# Patient Record
Sex: Male | Born: 1935 | Race: Black or African American | Hispanic: No | Smoking: Never smoker
Health system: Southern US, Community
[De-identification: ages and names within clinical notes are randomized; demographics above are authoritative.]

## PROBLEM LIST (undated history)

## (undated) DIAGNOSIS — I10 Essential (primary) hypertension: Secondary | ICD-10-CM

---

## 2009-03-24 ENCOUNTER — Ambulatory Visit: Payer: Self-pay | Admitting: Family Medicine

## 2009-03-24 ENCOUNTER — Inpatient Hospital Stay (HOSPITAL_COMMUNITY): Admission: EM | Admit: 2009-03-24 | Discharge: 2009-03-28 | Payer: Self-pay | Admitting: Emergency Medicine

## 2009-03-26 ENCOUNTER — Encounter: Payer: Self-pay | Admitting: Family Medicine

## 2009-03-27 ENCOUNTER — Ambulatory Visit: Payer: Self-pay | Admitting: Dentistry

## 2009-04-04 ENCOUNTER — Encounter: Admission: AD | Admit: 2009-04-04 | Discharge: 2009-04-04 | Payer: Self-pay | Admitting: Dentistry

## 2011-01-28 LAB — PROTIME-INR
INR: 1.7 — ABNORMAL HIGH (ref 0.00–1.49)
Prothrombin Time: 18 seconds — ABNORMAL HIGH (ref 11.6–15.2)
Prothrombin Time: 21.1 seconds — ABNORMAL HIGH (ref 11.6–15.2)

## 2011-01-28 LAB — CBC
HCT: 35.6 % — ABNORMAL LOW (ref 39.0–52.0)
HCT: 36.9 % — ABNORMAL LOW (ref 39.0–52.0)
HCT: 37.1 % — ABNORMAL LOW (ref 39.0–52.0)
HCT: 38.1 % — ABNORMAL LOW (ref 39.0–52.0)
Hemoglobin: 11.9 g/dL — ABNORMAL LOW (ref 13.0–17.0)
Hemoglobin: 12.9 g/dL — ABNORMAL LOW (ref 13.0–17.0)
MCHC: 32.8 g/dL (ref 30.0–36.0)
MCV: 89.2 fL (ref 78.0–100.0)
MCV: 90.9 fL (ref 78.0–100.0)
MCV: 91.5 fL (ref 78.0–100.0)
Platelets: 77 10*3/uL — ABNORMAL LOW (ref 150–400)
Platelets: 86 10*3/uL — ABNORMAL LOW (ref 150–400)
Platelets: 86 10*3/uL — ABNORMAL LOW (ref 150–400)
Platelets: 99 10*3/uL — ABNORMAL LOW (ref 150–400)
RBC: 4.14 MIL/uL — ABNORMAL LOW (ref 4.22–5.81)
RBC: 4.22 MIL/uL (ref 4.22–5.81)
RBC: 4.28 MIL/uL (ref 4.22–5.81)
RDW: 14.1 % (ref 11.5–15.5)
RDW: 14.5 % (ref 11.5–15.5)
RDW: 15.2 % (ref 11.5–15.5)
RDW: 15.3 % (ref 11.5–15.5)
WBC: 10.3 10*3/uL (ref 4.0–10.5)
WBC: 11.3 10*3/uL — ABNORMAL HIGH (ref 4.0–10.5)
WBC: 12.4 10*3/uL — ABNORMAL HIGH (ref 4.0–10.5)
WBC: 12.5 10*3/uL — ABNORMAL HIGH (ref 4.0–10.5)
WBC: 13.5 10*3/uL — ABNORMAL HIGH (ref 4.0–10.5)

## 2011-01-28 LAB — URINALYSIS, ROUTINE W REFLEX MICROSCOPIC
Nitrite: NEGATIVE
Specific Gravity, Urine: 1.025 (ref 1.005–1.030)
pH: 6 (ref 5.0–8.0)

## 2011-01-28 LAB — COMPREHENSIVE METABOLIC PANEL
ALT: 41 U/L (ref 0–53)
AST: 54 U/L — ABNORMAL HIGH (ref 0–37)
AST: 64 U/L — ABNORMAL HIGH (ref 0–37)
Albumin: 2.2 g/dL — ABNORMAL LOW (ref 3.5–5.2)
Albumin: 2.6 g/dL — ABNORMAL LOW (ref 3.5–5.2)
Albumin: 3.1 g/dL — ABNORMAL LOW (ref 3.5–5.2)
Alkaline Phosphatase: 63 U/L (ref 39–117)
Alkaline Phosphatase: 65 U/L (ref 39–117)
BUN: 17 mg/dL (ref 6–23)
Calcium: 8 mg/dL — ABNORMAL LOW (ref 8.4–10.5)
Chloride: 104 mEq/L (ref 96–112)
Chloride: 108 mEq/L (ref 96–112)
Chloride: 109 mEq/L (ref 96–112)
Creatinine, Ser: 1.64 mg/dL — ABNORMAL HIGH (ref 0.4–1.5)
GFR calc Af Amer: 50 mL/min — ABNORMAL LOW (ref >60)
Glucose, Bld: 125 mg/dL — ABNORMAL HIGH (ref 70–99)
Potassium: 3.3 mEq/L — ABNORMAL LOW (ref 3.5–5.1)
Potassium: 3.7 mEq/L (ref 3.5–5.1)
Sodium: 141 mEq/L (ref 135–145)
Total Bilirubin: 0.3 mg/dL (ref 0.3–1.2)
Total Bilirubin: 0.9 mg/dL (ref 0.3–1.2)
Total Bilirubin: 1.7 mg/dL — ABNORMAL HIGH (ref 0.3–1.2)
Total Protein: 6 g/dL (ref 6.0–8.3)
Total Protein: 6.3 g/dL (ref 6.0–8.3)

## 2011-01-28 LAB — BASIC METABOLIC PANEL
BUN: 13 mg/dL (ref 6–23)
CO2: 21 mEq/L (ref 19–32)
Calcium: 7.4 mg/dL — ABNORMAL LOW (ref 8.4–10.5)
Calcium: 8.4 mg/dL (ref 8.4–10.5)
Creatinine, Ser: 1.12 mg/dL (ref 0.4–1.5)
Creatinine, Ser: 1.25 mg/dL (ref 0.4–1.5)
GFR calc Af Amer: >60 mL/min (ref >60)
GFR calc non Af Amer: 56 mL/min — ABNORMAL LOW (ref >60)
GFR calc non Af Amer: >60 mL/min (ref >60)
Glucose, Bld: 127 mg/dL — ABNORMAL HIGH (ref 70–99)
Potassium: 3.6 mEq/L (ref 3.5–5.1)
Sodium: 140 mEq/L (ref 135–145)

## 2011-01-28 LAB — CARDIAC PANEL(CRET KIN+CKTOT+MB+TROPI)
CK, MB: 4.5 ng/mL — ABNORMAL HIGH (ref 0.3–4.0)
Relative Index: 0.7 (ref 0.0–2.5)
Total CK: 585 U/L — ABNORMAL HIGH (ref 7–232)
Total CK: 970 U/L — ABNORMAL HIGH (ref 7–232)
Troponin I: 0.11 ng/mL — ABNORMAL HIGH (ref 0.00–0.06)
Troponin I: 0.14 ng/mL — ABNORMAL HIGH (ref 0.00–0.06)

## 2011-01-28 LAB — DIFFERENTIAL
Basophils Absolute: 0 10*3/uL (ref 0.0–0.1)
Basophils Relative: 0 % (ref 0–1)
Eosinophils Absolute: 0 10*3/uL (ref 0.0–0.7)
Eosinophils Relative: 0 % (ref 0–5)
Lymphocytes Relative: 5 % — ABNORMAL LOW (ref 12–46)
Lymphs Abs: 0.6 10*3/uL — ABNORMAL LOW (ref 0.7–4.0)
Monocytes Absolute: 0.5 10*3/uL (ref 0.1–1.0)
Monocytes Absolute: 1.6 10*3/uL — ABNORMAL HIGH (ref 0.1–1.0)
Monocytes Relative: 5 % (ref 3–12)
Monocytes Relative: 9 % (ref 3–12)
Neutro Abs: 10.2 10*3/uL — ABNORMAL HIGH (ref 1.7–7.7)
Neutro Abs: 14 10*3/uL — ABNORMAL HIGH (ref 1.7–7.7)
Neutrophils Relative %: 83 % — ABNORMAL HIGH (ref 43–77)

## 2011-01-28 LAB — RENAL FUNCTION PANEL
CO2: 23 mEq/L (ref 19–32)
Chloride: 116 mEq/L — ABNORMAL HIGH (ref 96–112)
GFR calc non Af Amer: >60 mL/min (ref >60)
Glucose, Bld: 121 mg/dL — ABNORMAL HIGH (ref 70–99)
Potassium: 4.2 mEq/L (ref 3.5–5.1)
Sodium: 144 mEq/L (ref 135–145)

## 2011-01-28 LAB — CULTURE, BLOOD (ROUTINE X 2): Culture: NO GROWTH

## 2011-01-28 LAB — CALCIUM, IONIZED: Calcium, Ion: 1.13 mmol/L (ref 1.12–1.32)

## 2011-01-28 LAB — URINE MICROSCOPIC-ADD ON

## 2011-01-28 LAB — DIC (DISSEMINATED INTRAVASCULAR COAGULATION)PANEL
Fibrinogen: 556 mg/dL — ABNORMAL HIGH (ref 204–475)
Platelets: 88 10*3/uL — ABNORMAL LOW (ref 150–400)
Smear Review: NONE SEEN

## 2011-01-28 LAB — APTT: aPTT: 32 seconds (ref 24–37)

## 2011-03-05 NOTE — Op Note (Signed)
NAMEKYRE, JEFFRIES NO.:  [id number]   MEDICAL RECORD NO.:  [id number]          PATIENT TYPE:  INP   LOCATION:  2024                         FACILITY:  [facility]   PHYSICIAN:  Charlynne Pander, D.D.S.DATE OF BIRTH:  07-04-1930   DATE OF PROCEDURE:  03/28/2009  DATE OF DISCHARGE:  03/28/2009                               OPERATIVE REPORT   PREOPERATIVE DIAGNOSES:  1. History of fever of unknown origin.  2. Apical periodontitis.  3. Chronic periodontitis.  4. Accretions.   POSTOPERATIVE DIAGNOSES:  1. History of fever of unknown origin.  2. Apical periodontitis.  3. Chronic periodontitis.  4. Accretions.   OPERATION:  Surgical extraction of tooth #30 with alveoloplasty.   SURGEON:  Charlynne Pander, DDS   ASSISTANT:  Zettie Pho (dental assistant).   ANESTHESIA:  Monitored anesthesia care per the Anesthesia team.   MEDICATIONS:  1. Ancef 1 g IV prior to invasive dental procedures.  2. Local anesthesia with a total utilization of 1 carpule containing      34 mg of lidocaine with 0.017 mg of epinephrine as well as 1      carpule containing 9 mg of bupivacaine with 0.009 mg of      epinephrine.   SPECIMENS:  There was 1 tooth that was discarded.   DRAINS:  None.   CULTURES:  None.   ESTIMATED BLOOD LOSS:  Less than 25 mL.   FLUIDS:  300 mL of lactated Ringer solution.   COMPLICATIONS:  None.   INDICATIONS:  The patient was recently admitted with a history of fever  of unknown origin and hypotension.  The patient was subsequently found  to have some periapical pathology involving the lower right molar.  Dental consultation was requested to evaluate and provide treatment as  indicated.  The patient was treatment planned for extraction of tooth  #30 and other indicated teeth with alveoloplasty as indicated along with  gross debridement of the remaining dentition.  This treatment plan was  formulated to decrease the risk and complications  associated with dental  infection from further affecting the patient's systemic health.   OPERATIVE FINDINGS:  The patient was examined in operating room #1.  Tooth #30 was identified for extraction.  The patient was noted be an  affected by chronic periodontitis, apical periodontitis, and accretions.   DESCRIPTION OF PROCEDURE:  The patient was brought to the main operating  room #1.  The patient was then placed in the supine position on the  operating room table.  Monitored anesthesia care was then induced per  the Anesthesia team.  The patient was then prepped and draped in usual  manner for dental medicine procedure.  A time-out was performed.  The  patient was identified.  Procedures were verified.  The oral cavity was  then thoroughly examined with the findings as noted above.  The patient  was then ready for the dental medicine procedure as follows:   Local anesthesia was administered at the initiation of the procedure  with an inferior alveolar nerve block utilizing bupivacaine with  epinephrine and further infiltration to the lower  right area utilizing  the lidocaine with epinephrine..   The mandibular right quadrant was first approached.  The patient was  given an inferior alveolar nerve block with the bupivacaine with  epinephrine.  Further infiltration was then achieved around tooth #30  with the lidocaine with epinephrine.  A 15-blade incision was then made  from the distal of #31 extended to the mesial of #30.  A surgical flap  was then carefully reflected.  Appropriate amounts of buccal and  interseptal bone was removed around tooth #30 at this time.  The tooth  was then subluxated with a series of straight elevators.  Tooth #30 was  then removed with a 23 forceps without complications.  Alveoplasty was  then performed utilizing rongeurs and bone file.  The surgical site was  then irrigated with copious amounts of sterile saline.  Granulation  tissue was removed from  the mesial and distal apices appropriately.  Surgical site was then irrigated with copious amounts of sterile saline.  The tissues were approximated and trimmed appropriately.  A piece of  Surgicel was placed in the extracted socket to aid hemostasis.  The  surgical site was then closed from the distal of #31 extended to the  mesial of #30 utilizing 3-0 chromic gut suture in a continuous  interrupted suture technique x1.   At this point in time, the remaining teeth were approached.  A gross  debridement procedure was initiated with a sonic scaler.  However,  sterile saline solution was starting to be aspirated by the patient due  to some light sedation and therefore due to the risk for aspiration  causing pneumonia, it was determined that a dental cleaning procedure  could be done in the future by the dentist of his choice.  At this point  in time, the dental treatment was stopped.  The entire mouth was  irrigated with copious amounts of sterile saline.  The patient was  examined for further complications.  Seeing none, the dental medicine  procedure was deemed to be complete.  The patient was then handed over  to the Anesthesia team for final disposition.  After appropriate amount  of time, the patient was taken to the post anesthesia care unit with  stable vital signs and good oxygenation level.  All counts were correct  for the dental medicine procedure.   The patient has been instructed to follow up with his primary dentist  for an exam, x-rays, and overall treatment planning in the near future.  The patient indicates that he does have a future appointment in later  June 2010.   Please note:  There was no significant purulence noted in the area of  #30.  The Family Practice team was informed of this and will follow up  for possible other etiologies for the fever of unknown origin as  indicated.      Charlynne Pander, D.D.S.  Electronically Signed     RFK/MEDQ  D:   03/28/2009  T:  03/29/2009  Job:  [id number]   cc:   Leighton Roach McDiarmid, M.D.  Delbert Harness, MD

## 2011-03-05 NOTE — Discharge Summary (Signed)
NAMEFRANCOIS, ELK NO.:  [id number]   MEDICAL RECORD NO.:  [id number]           PATIENT TYPE:   LOCATION:                                 FACILITY:   PHYSICIAN:  Leighton Roach McDiarmid, M.D.DATE OF BIRTH:  1930/02/15   DATE OF ADMISSION:  DATE OF DISCHARGE:                               DISCHARGE SUMMARY   PRIMARY CARE PHYSICIAN:  Dr. Cain Saupe of [place] [place].   DISCHARGE DIAGNOSES:  1. Apical and chronic periodontitis.  2. Hypertension.  3. Hyperlipidemia.  4. Thrombocytopenia.  5. Acute renal insufficiency, resolved.  6. Increased INR, resolved.  7. Increased troponins, resolved.  8. Sclerotic bone on chest x-ray.   DISCHARGE MEDICATIONS:  1. Hydrochlorothiazide 25 mg daily.  2. Norvasc 5 mg daily.  3. Lovastatin 40 mg daily.  4. Aspirin 81 mg daily.  5. Augmentin 875 mg q.12 h. for 7 days.  6. Percocet 5/325 mg 1 p.o. q.6 h. p.r.n. pain.   CONSULTATIONS:  1. Dr. Charlynne Pander, DDS, dental medicine, the patient is to      call for followup appointment.  2. [facility].  The patient is to call for followup      appointment in 2-4 weeks.   PROCEDURES:  1. Two-view chest x-ray shows no acute cardiopulmonary process.  There      is diffuse increased density of the bones.  2. Panorex shows periapical abscess involving the most posterior right-      sided lower tooth.  3. CT sinus shows no significant sinus disease.  Probable periapical      abscess is noted bilaterally in the maxilla at the tooth root.   LABORATORY STUDIES:  1. Urinalysis and urine micro not suggestive of infection.  Urinalysis      showed 30 protein, negative nitrites, trace leukocytes, negative      ketones, large blood.  2. CBC:  On admission, white blood count 11.3, hemoglobin 13.9, and      platelets 99.  On day of discharge, white blood count 12.4,      hemoglobin 11.9, platelets 86.  3. Venous lactic acid normal at 2.1 on admission.  4.  Elevated liver enzymes:  The patient's liver enzymes showed a total      bilirubin of 1.7, alkaline phosphatase 72, AST 64, ALT 55, albumin      3.1 on admission.  By discharge, these values had normalized with a      total bilirubin at 0.3, alk phos 55, AST 25, ALT 41, total protein      6.0.  Albumin remained low at 2.2.  5. On admission, INR was 1.7.  By discharge, it had decreased to 1.1.  6. On admission, the patient's creatinine was 1.64.  This trended down      and on discharge, the patient's creatinine was 0.97.  7. Cardiac enzymes:  The patient had cardiac enzymes showing a      troponin of 0.05, 0.05, 0.14, 0.11.  8. Peripheral smear:  Blood smear shows decreased platelets.  A mild      left shift and  Dohle bodies.  9. DIC panel shows platelets of 88, no schistocytes seen.  PTT 30,      fibrinogen 556, D-dimer 3.25.  10.TSH 3.8.  11.Blood cultures showed Gram-positive rods x1 bottle.   BRIEF HOSPITAL COURSE:  This is a 77 year old with past medical history  of hypertension, hyperlipidemia, who presented to the emergency  department with a 2-day history of fevers and chills.  He was noted to  be hypotensive with a blood pressure of as low as 84/54.  Due to fever,  elevated white blood count, the source was thought to be infection, and  the patient was admitted to the ICU under sepsis protocol due to his  p[ersistant hypotension despite 3 liters of fluid.  The patient quickly  recovered and was transferred out of ICU in less than 24 hours after  admission.  Due to the patient's poor dentition, imaging was obtained  that identified the oral cavity as the likely source of infection.  The  patient was started on Zosyn until dental extraction could be performed.  1. Dental abscess:  This is likely the source of the patient's blood      culture showing Gram-positive rods.  The patient received 5 days of      Zosyn therapy.  On day of discharge, the patient received a dental       extraction of tooth #30.  The patient was discharged home on      Augmentin for 7 days.  2. Blood pressure:  The patient's blood pressure medications were held      on admission due to hypotension.  The patient's blood pressure      normalized throughout the hospital course, and the patient asked to      restart hydrochlorothiazide and Norvasc upon discharge.  3. Acute renal insufficiency:  The patient had acute renal      insufficiency likely due to low volume status and hypotension.  The      patient's creatinine improved over the next few days with iv      hydrationand was normal by discharge.  4. Sclerotic bone found on chest x-ray:  Unclear etiology of sclerotic      bone.  Main concern was prostate cancer, and the patient had a      normal prostate exam and PSA during this hospitalization.  We      attempted to get records from the patient's urologist, but we were      unable to acquire them prior to discharge.  The patient was urged      to follow up with his PCP and urologist on this matter.  5. Thrombocytopenia:  The patient's thrombocytopenia is likely      secondary to an infection.  The patient's platelets remained stable      throughout hospitalization.  6. Elevated Troponin: The patient was admitted with mild nonspecific      ST-depression changes on his EKG and a mild increase in his      troponin, likely secondary to hypovolemia.  The patient had an      echocardiogram, which showed mild LVH with normal systolic function      in the range of 55-60%.  No wall motion abnormalities noted.  The      patient was instructed to follow up for an outpatient stress test      upon discharge.   The patient discharged home in stable condition.   FOLLOWUP APPOINTMENTS:  The patient is to follow  up with [facility]  [facility]; Dr. Kristin Bruins, Dentistry, Dr. Wyline Mood, PCP, for appointment.      Delbert Harness, MD  Electronically Signed      Leighton Roach McDiarmid, M.D.   Electronically Signed    KB/MEDQ  D:  03/29/2009  T:  03/30/2009  Job:  [id number]   cc:   Charlynne Pander, D.D.S.  Dr. Cain Saupe  [facility]

## 2011-03-05 NOTE — H&P (Signed)
Ryan Nash, Ryan Nash NO.:  [id number]   MEDICAL RECORD NO.:  [id number]          PATIENT TYPE:  INP   LOCATION:  3109                         FACILITY:  [facility]   PHYSICIAN:  Leighton Roach McDiarmid, M.D.DATE OF BIRTH:  01-10-1930   DATE OF ADMISSION:  03/24/2009  DATE OF DISCHARGE:                              HISTORY & PHYSICAL   PRIMARY CARE PHYSICIAN:  Dr. Sondra Come in [facility], admitted to  [facility] as an unassigned.   CHIEF COMPLAINT:  Fever and hypotension.   HISTORY OF PRESENT ILLNESS:  A 77 year old with hypertension and  hyperlipidemia comes to ER after 2-day history of fever and chills, was  feeling well until 2 days ago when he had a fever at home up to 104 and  some chills.  He had slightly decreased appetite and p.o. intake for  these past few days.  Otherwise, his review of systems is almost  completely negative.  He has no weight loss, cough, nausea, vomiting,  diarrhea, sick contacts, recent travel other than to [place], no  dysuria.  In the ER, he received Tylenol 1500 mL bolus, 40 mEq of  potassium.   PAST MEDICAL HISTORY:  Hypertension and hyperlipidemia.  No other  medical problems that he is aware of.   ALLERGIES:  No known drug allergies.   MEDICATIONS:  1. Hydrochlorothiazide 25 daily.  2. Norvasc 5 daily.  3. Lovastatin 40 mg daily.   He is a full code.  He lives with his wife and grandson.  He is a  retired Solicitor for [employer].  No history of tobacco whatsoever.  No  alcohol use currently.  No drug use.   FAMILY HISTORY:  Significant for hypertension in his mother.  Hypertension and diabetes in siblings.  No history of cancer in his  family.   REVIEW OF SYSTEMS:  Positive for fevers, chills, decreased appetite for  a few days, and some left knee pain that he attributes to arthritis.  Otherwise, his review of systems is negative including no reports of  sweats, fatigue, weight change, headache, sore  throat, neck pain, chest  pain, edema, orthopnea, PND, cough, dyspnea, wheezing, sputum,  hemoptysis, nausea, vomiting, diarrhea, bright red blood per rectum,  melena, abdominal pain.  No reports of hematuria, incontinence, rashes,  ecchymoses, myalgias, dysarthria, focal weakness, dizziness, vertigo,  petechiae, easy bruising, dry skin, or hair.   PHYSICAL EXAMINATION:  VITAL SIGNS:  His temperature is 101.0 to 101.8  in the ER, pulse 114 initially down to 101, respirations between 29 and  27, blood pressure initially 93/54 down to 84/54, pulse ox 96-98% on  room air to 2 L.  GENERAL:  He is pleasant in no acute distress.  HEENT:  Pupils equally round and reactive to light.  TMs clear.  Oropharynx clear.  NECK:  Supple.  He has mild submandibular lymphadenopathy.  Good range  of motion.  CARDIOVASCULAR:  Tachycardic.  Distal heart sounds.  Regular rhythm.  No  murmur.  LUNGS:  Clear to auscultation bilaterally.  ABDOMEN:  Soft, nontender, nondistended.  Normoactive bowel sounds.  BACK:  Nontender to palpation.  He has no CVA tenderness.  RECTAL:  Per the emergency room PA was normal and nontender.  I did not  do rectal exam myself.  EXTREMITIES:  No edema.  He had 2+ DP pulses on the right, 1+ DP pulse  on the left.  No calf tenderness.  No palpable cords.  Negative Homans'  sign.  NEURO:  He is alert and oriented x3.  He is moving all extremities  normally.  His face is symmetric.  LYMPH NODE:  He had some mild submandibular lymphadenopathy, but no  axillary, no inguinal lymphadenopathy.   LABS AND STUDIES:  BMET, sodium 135, potassium low at 2.7, chloride 103,  bicarb 23, BUN 20, creatinine elevated at 1.64.  Platelets were 99,  white blood cell count elevated 11.3, hemoglobin 13.9, hematocrit 41.3.  His blood glucose is 80, 90% neutrophils, 5% lymphs.  ANC is 10.2.  UA,  specific gravity 1.025, small bili, large blood, 30 of protein, negative  nitrite, trace leukocyte  esterase, 0-2 white blood cells, 3-6 red blood  cells, few bacteria.  Liver functions, t-bili 1.7, alk phos 72, AST 64,  ALT 55, T-protein 6.3, albumin 3.1, calcium 8.0.  Lactic acid is normal  at 2.1.  Chest x-ray shows diffuse sclerosis of bones with followup bone  scan recommended for further assessment, but no acute cardiopulmonary  process.  EKG shows sinus tachycardia.  It shows less than 1-mm ST  depression in II, III, and aVF and again in V3 through V6.  No old EKGs  for comparison.  Blood cultures were drawn and are pending.   ASSESSMENT/PLAN:  1. The patient is a 77 year old with hypertension, hyperlipidemia      admitted for fever and hypotension.  This is presumably infectious      with increased white blood cell count, decreased platelets, but      there are no signs or symptoms to indicate a source for his      infection.  Technically meets the criteria for septic shock      protocol with temperature greater than 38.3, pulse greater than 90,      respirations greater than 20.  He seems quite comfortable.  Again,      there is no obvious source of infection.  No respiratory, GI, or      urinary complaints.  No exotic travel.  No sick contacts.  He has      got a normal chest x-ray with the exception of the sclerotic bones      and a nonimpressive UA, he has got normal lactate.  So far he has      received almost 3 L of fluid and is still with a systolic blood      pressure in the 80s and map between 60 and 65.  It is possible this      could be a viral illness or occult malignancy, but early sepsis is      certainly a strong possibility that is concerning.  Plan:  We will      bolus one more liter of normal saline, if that does not increase      this map over 65, we will consider starting septic shock protocol,      which includes a CCM consult, arterial blood gas, co-oximetry,      empiric antibiotics.  We will also check blood cultures, check      urine culture, check a  sputum Gram-stain, and culture  some coags      with PT/PTT, INR.  We will check cardiac markers x3 and then a.m.      EKG.  I am going to go ahead and check an urine strep antigen,      urine Legionella just to look for any sort of infection that I can      get up and I will admit to step-down or ICU bed.  2. Renal insufficiency.  Creatinine today is 1.64.  The patient is not      aware that he has any renal problems, most likely this is an acute      situation probably from decreased perfusion due to his low volume      and hypotension.  Plan:  We will give IV fluids, recheck a BMET in      the a.m., if his creatinine does improve over the next few days      with fluid, we will proceed with further workup such as renal      ultrasound, etc.  3. Sclerotic bone on chest x-ray.  Main concern is prostate cancer.      Prostate exam is normal per the emergency room PA.  I am going to      go ahead and check a PSA and depending on what this shows, consider      a bone scan.  4. Thrombocytopenia.  Platelets are 99 perhaps due to the consultant      process from infection.  For now we will just monitor.  5. EKG changes.  He does have a little bit of mild nonspecific ST      depression, but they are in continuous leads.  We will check      cardiac enzymes and a.m. EKG.  Admit to a 70-hour ICU bed.  6. Hypokalemia.  K of 2.7.  He is already given 40 mEq of KCl in the      ER.  I am going to give an additional 40 tonight and recheck in the      morning.  I will be cautious due to his increased creatinine.  7. Hyperbilirubinemia with a total bili of 1.7 and mildly elevated      transaminases, not sure what the cause is, may be this is a mild      shock liver from hypotension.  I guess it also could be from his      statin.  I will look baseline for comparison.  8. Fluid, electrolyte, nutrition.  We will place n.p.o. until we      decide about the sepsis protocol.  9. Prophylaxis.  We will give  heparin t.i.d. and Protonix.  10.Hypertension.  He is actually hypotensive now, so we will hold his      blood pressure meds.  11.Hyperlipidemia.  I will go ahead and hold his statin because of his      increased AST and ALT.      Asher Muir, MD  Electronically Signed      Leighton Roach McDiarmid, M.D.  Electronically Signed    SO/MEDQ  D:  03/24/2009  T:  03/24/2009  Job:  [id number]

## 2011-03-05 NOTE — Consult Note (Signed)
[name], Ryan NO.:  [id number]   MEDICAL RECORD NO.:  [id number]          PATIENT TYPE:  INP   LOCATION:  2024                         FACILITY:  [facility]   PHYSICIAN:  Ryan Nash, D.D.S.DATE OF BIRTH:  Feb 07, 1930   DATE OF CONSULTATION:  03/27/2009  DATE OF DISCHARGE:                                 CONSULTATION   Ryan Nash is a 77 year old male referred by Dr. Earnest Bailey for a  dental consultation.  The patient with a history of fever of unknown  origin.  Dental consultation was requested to rule out dental etiology  and to provide dental treatment as indicated.   PAST MEDICAL HISTORY:  1. History of fever of unknown origin with hypotension--- reason for      this admission.  2. History of hypertension.  3. Hyperlipidemia.  4. Thrombocytopenia.  5. Acute renal insufficiency --this admission.   ALLERGIES:  None known.   MEDICATIONS:  1. Aspirin 81 mg daily.  2. Coreg 3.125 mg twice daily.  3. Heparin 8000 units every 8 hours currently on hold.  4. Zosyn 3.375 g IV every 8 hours.  5. Zocor 20 mg every evening, currently on hold.   SOCIAL HISTORY:  The patient is married and lives with his wife in  [place].  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  The  patient has never smoked.  The patient does not currently use alcohol.   FAMILY HISTORY:  Mother with a history of hypertension.  Several  siblings with a history of hypertension and diabetes mellitus.   FUNCTIONAL ASSESSMENT:  The patient was independent for ADLs prior to  this admission.   REVIEW OF SYSTEMS:  This is reviewed from the chart and health history  assessment form for this admission.   CHIEF COMPLAINT:  Dental consultation requested to evaluate dentition  and to rule out dental etiology for the fever of unknown origin.   HISTORY OF PRESENT ILLNESS:  The patient was admitted with a history of  fever of unknown origin with gram-positive bacteremia.  The  patient  currently on Zosyn IV antibiotic therapy.  Dental consultation requested  to rule out dental etiology due to history of possible periapical  pathology involving lower right molar.  The patient currently denies  acute toothaches.  The patient indicates that a lower right molar (tooth  #30) was recently evaluated approximately 8 months ago in [place] for  dental treatment.  The patient wanted the tooth extracted at that time,  but dentist decided to save the tooth and performed a dental  restoration.  The patient last had his teeth cleaned approximately 1  year ago.  The patient is currently looking for a dentist who takes his  [employer].  The patient does have an  appointment scheduled for later this month for an exam cleaning and  comprehensive x-rays as indicated.   PHYSICAL EXAMINATION:  GENERAL:  The patient is a well-developed, well-  nourished male in no acute distress.  VITAL SIGNS:  Blood pressure is 101/64, pulse rate is 77, respirations  are 20,  temperature is 98.6.  NECK:  There is no palpable lymphadenopathy.  The patient denies acute  TMJ symptoms.   INTRAORAL EXAM:  The patient with normal saliva.  There is no evidence  of abscess formation.   DENTITION:  The patient with multiple missing teeth.  I would need a  full series of dental radiographs to identify the extent of the missing  present teeth.   PERIODONTAL:  The patient with chronic periodontitis with plaque  calculus accumulations, generalized gingival recession and incipient  tooth mobility.   DENTAL CARIES:  There are no significant dental caries noted at this  time.  I would need a full series of dental radiographs to rule out  incipient dental caries.   ENDODONTIC:  The patient denies acute pulpitis symptoms.  There does  appear to be periapical pathology at the apices of tooth #30.   CROWN OR BRIDGE:  There appears to be one crown restoration on tooth #6.    PROSTHODONTIC:  The patient denies having partial dentures.   OCCLUSION:  The patient with a poor occlusal scheme secondary to  multiple missing teeth, supereruption and drifting of the unopposed  teeth into the edentulous areas, and lack of replacement of all missing  teeth with dental prosthesis.   RADIOGRAPHIC INTERPRETATION:  A panoramic x-ray was obtained on March 25, 2009, by the department of Radiology.   There are multiple missing teeth.  There are multiple diastemata.  There  is moderate horizontal vertical bone loss.  There is periapical  radiolucency at the apices of tooth #30.  There are no significant  dental caries noted at this time.  I would suggest a full series of  dental radiographs to rule out other incipient caries and periapical  pathology by the dentist of his choice along with evaluation for  comprehensive dental care.   ASSESSMENT:  1. Apical periodontitis at the apices of tooth #30.  2. History of fever of unknown origin with possible dental etiology      related to tooth #30.  3. Chronic periodontitis with bone loss.  4. Plaque and calculus accumulations.  5. Generalized gingival recession.  6. Incipient tooth mobility.  7. Multiple missing teeth.  8. Multiple diastemata.  9. Poor occlusal scheme.  10.No apparent history of partial dentures.  11.Need for comprehensive dental evaluation and followup with a      general dentist as currently planned by the patient.  12.Current heparin therapy with a risk for bleeding with invasive      dental procedures.  13.Thrombocytopenia with a risk for bleeding with invasive dental      procedures.   PLAN/RECOMMENDATIONS:  1. I have discussed risks, benefits, and complications of various      treatment options with the patient in relationship to his medical      and dental conditions.  We discussed various treatment options to      include no treatment, extraction of indicated teeth with      alveoloplasty,  periodontal therapy, dental restorations, root canal      therapy, crown or bridge therapy, implant therapy, and replacing      the missing teeth as indicated.  The patient currently wishes to      proceed with extraction of tooth #30 and other indicated teeth with      alveoloplasty and preprosthetic surgery as indicated along with      gross debridement of remaining dentition.  This has been scheduled  to the operating room for March 28, 2009, at 10:05 a.m.  Most likely      we will proceed with monitored anesthesia care without the need for      general anesthesia at this time.  We will hold heparin therapy at      this time and the patient has been instructed that possible      transfusion may be required if significant bleeding persists after      the dental extractions.  We will provide additional antibiotic      premedication with Ancef 1 g prior to invasive dental procedures.  2. Discussion of findings with medical team as indicated in      coordination of future care as indicated.      Ryan Nash, D.D.S.  Electronically Signed     RFK/MEDQ  D:  03/27/2009  T:  03/28/2009  Job:  [id number]   cc:   Leighton Roach McDiarmid, M.D.  Delbert Harness, MD

## 2013-11-03 ENCOUNTER — Emergency Department (HOSPITAL_COMMUNITY): Payer: Federal, State, Local not specified - PPO

## 2013-11-03 ENCOUNTER — Encounter (HOSPITAL_COMMUNITY): Payer: Self-pay | Admitting: Emergency Medicine

## 2013-11-03 ENCOUNTER — Emergency Department (HOSPITAL_COMMUNITY)
Admission: EM | Admit: 2013-11-03 | Discharge: 2013-11-03 | Disposition: A | Discharge status: Home / Self Care | Payer: Federal, State, Local not specified - PPO | Attending: Emergency Medicine | Admitting: Emergency Medicine

## 2013-11-03 DIAGNOSIS — S01309A Unspecified open wound of unspecified ear, initial encounter: Secondary | ICD-10-CM | POA: Insufficient documentation

## 2013-11-03 DIAGNOSIS — W19XXXA Unspecified fall, initial encounter: Secondary | ICD-10-CM

## 2013-11-03 DIAGNOSIS — Y929 Unspecified place or not applicable: Secondary | ICD-10-CM | POA: Insufficient documentation

## 2013-11-03 DIAGNOSIS — Y939 Activity, unspecified: Secondary | ICD-10-CM | POA: Insufficient documentation

## 2013-11-03 DIAGNOSIS — Z23 Encounter for immunization: Secondary | ICD-10-CM | POA: Insufficient documentation

## 2013-11-03 DIAGNOSIS — I10 Essential (primary) hypertension: Secondary | ICD-10-CM | POA: Insufficient documentation

## 2013-11-03 DIAGNOSIS — Z79899 Other long term (current) drug therapy: Secondary | ICD-10-CM | POA: Insufficient documentation

## 2013-11-03 DIAGNOSIS — S0191XA Laceration without foreign body of unspecified part of head, initial encounter: Secondary | ICD-10-CM

## 2013-11-03 DIAGNOSIS — W1809XA Striking against other object with subsequent fall, initial encounter: Secondary | ICD-10-CM | POA: Insufficient documentation

## 2013-11-03 DIAGNOSIS — Y92009 Unspecified place in unspecified non-institutional (private) residence as the place of occurrence of the external cause: Secondary | ICD-10-CM

## 2013-11-03 DIAGNOSIS — Z7982 Long term (current) use of aspirin: Secondary | ICD-10-CM | POA: Insufficient documentation

## 2013-11-03 DIAGNOSIS — W010XXA Fall on same level from slipping, tripping and stumbling without subsequent striking against object, initial encounter: Secondary | ICD-10-CM | POA: Insufficient documentation

## 2013-11-03 DIAGNOSIS — S0990XA Unspecified injury of head, initial encounter: Secondary | ICD-10-CM | POA: Insufficient documentation

## 2013-11-03 HISTORY — DX: Essential (primary) hypertension: I10

## 2013-11-03 MED ORDER — TETANUS-DIPHTH-ACELL PERTUSSIS 5-2.5-18.5 LF-MCG/0.5 IM SUSP
0.5000 mL | Freq: Once | INTRAMUSCULAR | Status: AC
  Administered 2013-11-03: 0.5 mL via INTRAMUSCULAR
  Filled 2013-11-03: qty 0.5

## 2013-11-03 NOTE — Discharge Instructions (Signed)
Tissue Adhesive Wound Care Some cuts, wounds, lacerations, and incisions can be repaired by using tissue adhesive. Tissue adhesive is like glue. It holds the skin together, allowing for faster healing. It forms a strong bond on the skin in about 1 minute and reaches its full strength in about 2 or 3 minutes. The adhesive disappears naturally while the wound is healing. It is important to take proper care of your wound at home while it heals.  HOME CARE INSTRUCTIONS   Showers are allowed. Do not soak the area containing the tissue adhesive. Do not take baths, swim, or use hot tubs. Do not use any soaps or ointments on the wound. Certain ointments can weaken the glue.  If a bandage (dressing) has been applied, follow your health care provider's instructions for how often to change the dressing.   Keep the dressing dry if one has been applied.   Do not scratch, pick, or rub the adhesive.   Do not place tape over the adhesive. The adhesive could come off when pulling the tape off.   Protect the wound from further injury until it is healed.   Protect the wound from sun and tanning bed exposure while it is healing and for several weeks after healing.   Only take over-the-counter or prescription medicines as directed by your health care provider.   Keep all follow-up appointments as directed by your health care provider. SEEK IMMEDIATE MEDICAL CARE IF:   Your wound becomes red, swollen, hot, or tender.   You develop a rash after the glue is applied.  You have increasing pain in the wound.   You have a red streak that goes away from the wound.   You have pus coming from the wound.   You have increased bleeding.  You have a fever.  You have shaking chills.   You notice a bad smell coming from the wound.   Your wound or adhesive breaks open.  MAKE SURE YOU:   Understand these instructions.  Will watch your condition.  Will get help right away if you are not doing  well or get worse. Document Released: 04/02/2001 Document Revised: 07/28/2013 Document Reviewed: 04/28/2013 [facility] Patient Information 2014 [place]. Fall Prevention and Home Safety Falls cause injuries and can affect all age groups. It is possible to use preventive measures to significantly decrease the likelihood of falls. There are many simple measures which can make your home safer and prevent falls. OUTDOORS  Repair cracks and edges of walkways and driveways.  Remove high doorway thresholds.  Trim shrubbery on the main path into your home.  Have good outside lighting.  Clear walkways of tools, rocks, debris, and clutter.  Check that handrails are not broken and are securely fastened. Both sides of steps should have handrails.  Have leaves, snow, and ice cleared regularly.  Use sand or salt on walkways during winter months.  In the garage, clean up grease or oil spills. BATHROOM  Install night lights.  Install grab bars by the toilet and in the tub and shower.  Use non-skid mats or decals in the tub or shower.  Place a plastic non-slip stool in the shower to sit on, if needed.  Keep floors dry and clean up all water on the floor immediately.  Remove soap buildup in the tub or shower on a regular basis.  Secure bath mats with non-slip, double-sided rug tape.  Remove throw rugs and tripping hazards from the floors. BEDROOMS  Install night lights.  Make sure a bedside light is easy to reach.  Do not use oversized bedding.  Keep a telephone by your bedside.  Have a firm chair with side arms to use for getting dressed.  Remove throw rugs and tripping hazards from the floor. KITCHEN  Keep handles on pots and pans turned toward the center of the stove. Use back burners when possible.  Clean up spills quickly and allow time for drying.  Avoid walking on wet floors.  Avoid hot utensils and knives.  Position shelves so they are not too high or  low.  Place commonly used objects within easy reach.  If necessary, use a sturdy step stool with a grab bar when reaching.  Keep electrical cables out of the way.  Do not use floor polish or wax that makes floors slippery. If you must use wax, use non-skid floor wax.  Remove throw rugs and tripping hazards from the floor. STAIRWAYS  Never leave objects on stairs.  Place handrails on both sides of stairways and use them. Fix any loose handrails. Make sure handrails on both sides of the stairways are as long as the stairs.  Check carpeting to make sure it is firmly attached along stairs. Make repairs to worn or loose carpet promptly.  Avoid placing throw rugs at the top or bottom of stairways, or properly secure the rug with carpet tape to prevent slippage. Get rid of throw rugs, if possible.  Have an electrician put in a light switch at the top and bottom of the stairs. OTHER FALL PREVENTION TIPS  Wear low-heel or rubber-soled shoes that are supportive and fit well. Wear closed toe shoes.  When using a stepladder, make sure it is fully opened and both spreaders are firmly locked. Do not climb a closed stepladder.  Add color or contrast paint or tape to grab bars and handrails in your home. Place contrasting color strips on first and last steps.  Learn and use mobility aids as needed. Install an electrical emergency response system.  Turn on lights to avoid dark areas. Replace light bulbs that burn out immediately. Get light switches that glow.  Arrange furniture to create clear pathways. Keep furniture in the same place.  Firmly attach carpet with non-skid or double-sided tape.  Eliminate uneven floor surfaces.  Select a carpet pattern that does not visually hide the edge of steps.  Be aware of all pets. OTHER HOME SAFETY TIPS  Set the water temperature for 120 F (48.8 C).  Keep emergency numbers on or near the telephone.  Keep smoke detectors on every level of the  home and near sleeping areas. Document Released: 09/27/2002 Document Revised: 04/07/2012 Document Reviewed: 12/27/2011 [facility] Patient Information 2014 [place], [place].

## 2013-11-03 NOTE — ED Notes (Signed)
Pt denies any n/v, lightheadedness, dizziness pt ambulated without difficulty

## 2013-11-03 NOTE — ED Provider Notes (Signed)
CSN: [id number]     Arrival date & time 11/03/13  0849 History   First MD Initiated Contact with Patient 11/03/13 0901     Chief Complaint  Patient presents with  . Fall  . Head Laceration   (Consider location/radiation/quality/duration/timing/severity/associated sxs/prior Treatment) HPI  This is an 80 year old male who presents following a fall. Patient states that he slipped and fell on the ice this morning hitting his head on a railing. He denies loss of consciousness. He denies any neck pain. Patient noted blood behind his right ear. He drove himself to work and then drove himself here. He denies any weakness, numbness, or tingling. He denies any anticoagulant use. He does take an aspirin daily.  He is unsure when his last tetanus shot was.  Past Medical History  Diagnosis Date  . Hypertension    History reviewed. No pertinent past surgical history. No family history on file. History  Substance Use Topics  . Smoking status: Never Smoker   . Smokeless tobacco: Not on file  . Alcohol Use: No    Review of Systems  Constitutional: Negative.  Negative for fever.  Respiratory: Negative.  Negative for chest tightness and shortness of breath.   Cardiovascular: Negative.  Negative for chest pain.  Gastrointestinal: Negative.  Negative for abdominal pain.  Genitourinary: Negative.   Musculoskeletal: Negative for back pain and neck pain.  Neurological: Positive for headaches.  All other systems reviewed and are negative.    Allergies  Review of patient's allergies indicates no known allergies.  Home Medications   Current Outpatient Rx  Name  Route  Sig  Dispense  Refill  . amLODipine (NORVASC) 5 MG tablet   Oral   Take 5 mg by mouth daily.         Marland Kitchen. aspirin EC 81 MG tablet   Oral   Take 81 mg by mouth daily.         . finasteride (PROSCAR) 5 MG tablet   Oral   Take 5 mg by mouth daily.         . hydrochlorothiazide (HYDRODIURIL) 25 MG tablet   Oral   Take 25  mg by mouth daily.         . IRON PO   Oral   Take 1 tablet by mouth daily.         Marland Kitchen. lovastatin (MEVACOR) 40 MG tablet   Oral   Take 40 mg by mouth every evening.         . silodosin (RAPAFLO) 4 MG CAPS capsule   Oral   Take 4 mg by mouth daily with breakfast.          BP 119/74  Pulse 62  Temp(Src) 97.2 F (36.2 C) (Oral)  Resp 15  SpO2 97% Physical Exam  Nursing note and vitals reviewed. Constitutional: He is oriented to person, place, and time. No distress.  Elderly  HENT:  Head: Normocephalic.  Right Ear: External ear normal.  Left Ear: External ear normal.  Mouth/Throat: Oropharynx is clear and moist.  1-1/2 cm laceration posterior to the right ear, no active bleeding  Eyes: Pupils are equal, round, and reactive to light.  Neck: Neck supple.  No midline tenderness  Cardiovascular: Normal rate, regular rhythm and normal heart sounds.   No murmur heard. Pulmonary/Chest: Effort normal and breath sounds normal. No respiratory distress. He has no wheezes.  Abdominal: Soft. Bowel sounds are normal. There is no tenderness. There is no rebound.  Musculoskeletal: He exhibits  no edema.  Lymphadenopathy:    He has no cervical adenopathy.  Neurological: He is alert and oriented to person, place, and time.  Skin: Skin is warm and dry.  Psychiatric: He has a normal mood and affect.    ED Course  Procedures (including critical care time) Labs Review Labs Reviewed - No data to display Imaging Review Ct Head Wo Contrast  11/03/2013   CLINICAL DATA:  Larey Seat.  Head laceration  EXAM: CT HEAD WITHOUT CONTRAST  TECHNIQUE: Contiguous axial images were obtained from the base of the skull through the vertex without intravenous contrast.  COMPARISON:  None.  FINDINGS: Generalized atrophy with mild chronic microvascular ischemic change in the white matter. No acute infarct. Negative for hemorrhage or mass. Negative for skull fracture.  IMPRESSION: No acute abnormality.    Electronically Signed   By: Marlan Palau M.D.   On: 11/03/2013 09:56    EKG Interpretation   None       MDM   1. Fall at home   2. Laceration of head     This is an 80 year old male who presents following a mechanical fall. He is nontoxic-appearing. He has been ambulatory. He drove himself to the hospital. Vital signs are within normal limits. He has a small laceration behind his right ear. CT head is negative. Patient's tetanus shot was updated. Given that the patient has been ambulatory and abnormal mental status, I do not feel he needs further workup as this is described as a mechanical fall. Laceration repaired by PA.  After history, exam, and medical workup I feel the patient has been appropriately medically screened and is safe for discharge home. Pertinent diagnoses were discussed with the patient. Patient was given return precautions.     Shon Baton, MD 11/03/13 1430

## 2013-11-03 NOTE — ED Notes (Signed)
Larey SeatFell this am and bumped head has small lac to back of rt ear drove himself here area still oozing at this time

## 2013-11-03 NOTE — ED Provider Notes (Signed)
LACERATION REPAIR Performed by: Johnnette GourdAlbert, Kedric Bumgarner Authorized by: Johnnette GourdAlbert, Pihu Basil Consent: Verbal consent obtained. Risks and benefits: risks, benefits and alternatives were discussed Consent given by: patient Patient identity confirmed: provided demographic data Prepped and Draped in normal sterile fashion Wound explored  Laceration Location: posterior to right ear  Laceration Length: 1.5 cm  No Foreign Bodies seen or palpated  Anesthesia: none  Irrigation method: syringe Amount of cleaning: standard  Skin closure: dermabond, steri-strips  Patient tolerance: Patient tolerated the procedure well with no immediate complications.   Trevor MaceRobyn M Albert, PA-C 11/03/13 1027

## 2013-11-04 NOTE — ED Provider Notes (Signed)
Medical screening examination/treatment/procedure(s) were conducted as a shared visit with non-physician practitioner(s) and myself.  I personally evaluated the patient during the encounter.  EKG Interpretation   None       Patient was seen primarily by myself and laceration repair performed by PA. He was immediately available during laceration repair.  Good cosmetic results achieved.  Shon Batonourtney F Cletus Paris, MD 11/04/13 [phone]

## 2015-11-04 IMAGING — CT CT HEAD W/O CM
1 series · 16 of 29 positions shown, 20 images · non-contrast
Comparison: None.

CLINICAL DATA: Fell.  Head laceration

EXAM:
CT HEAD WITHOUT CONTRAST
TECHNIQUE: Contiguous axial images were obtained from the base of the skull
through the vertex without intravenous contrast.

[Series 2: head 5.0 h30s · axial · 0.45mm/px · z∈[+1541,+1671]mm · 16 of 29 slices shown, 20 images]
[im 2/29  brain]
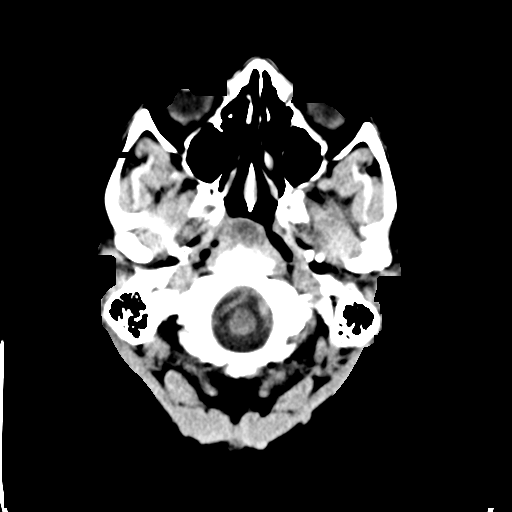
[im 2/29  bone]
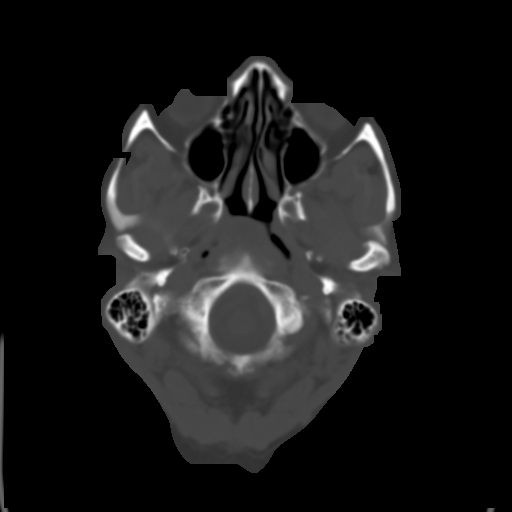
[im 4/29  brain]
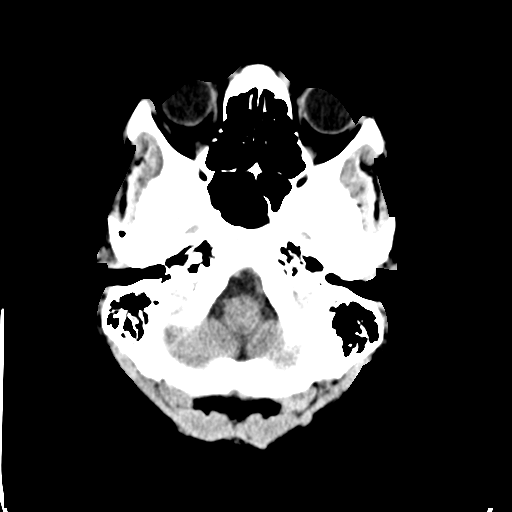
[im 6/29  brain]
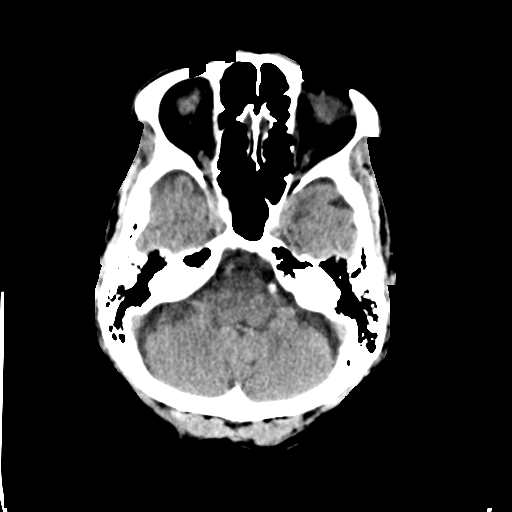
[im 7/29  brain]
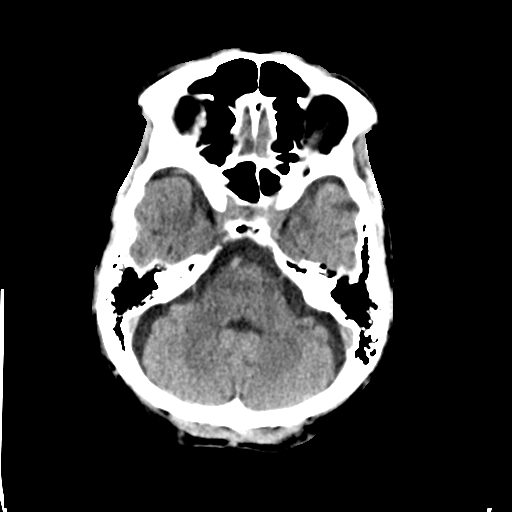
[im 9/29  brain]
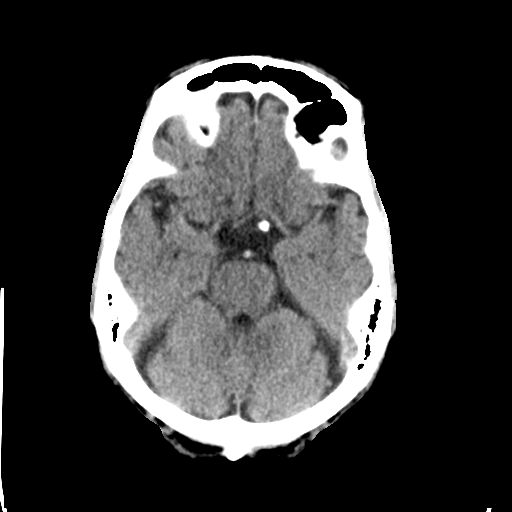
[im 9/29  bone]
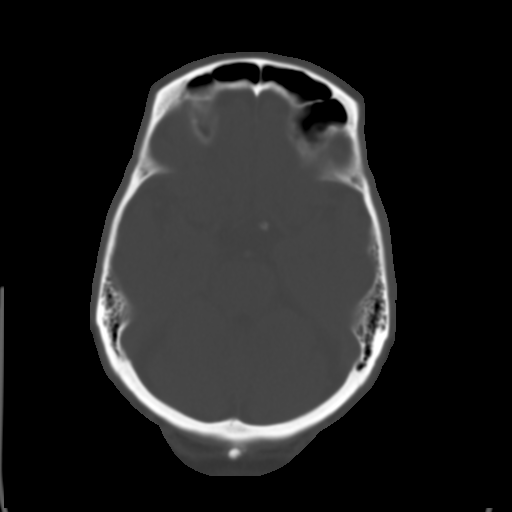
[im 11/29  brain]
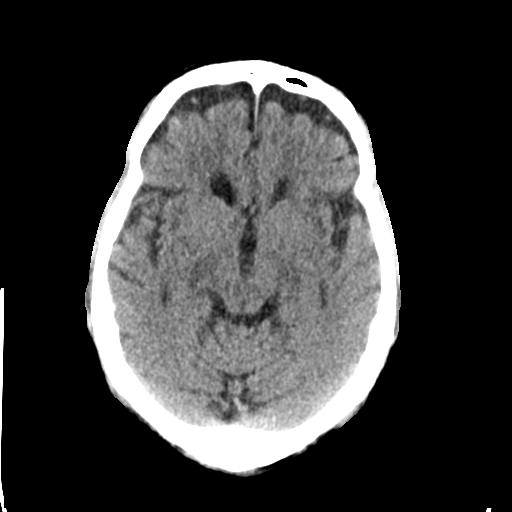
[im 12/29  brain]
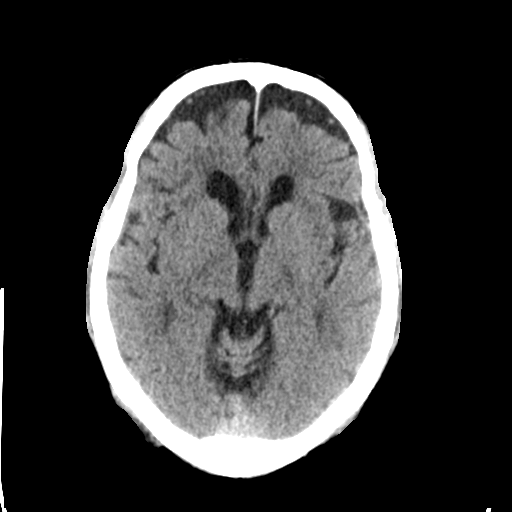
[im 14/29  brain]
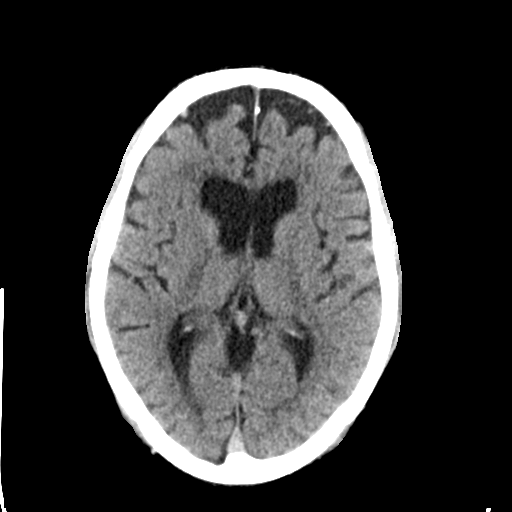
[im 16/29  brain]
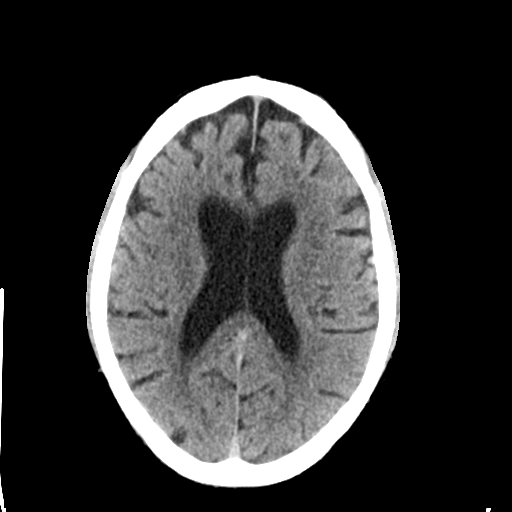
[im 16/29  bone]
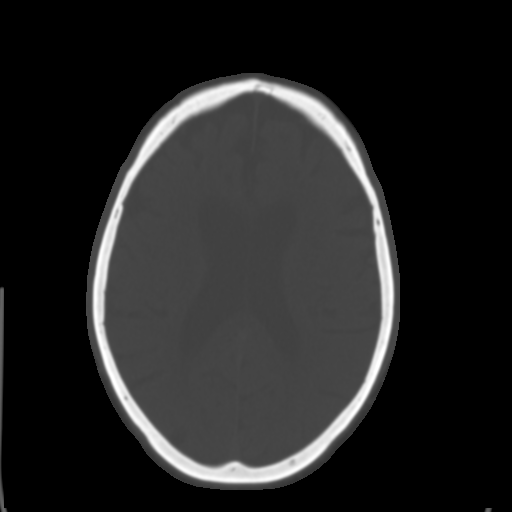
[im 18/29  brain]
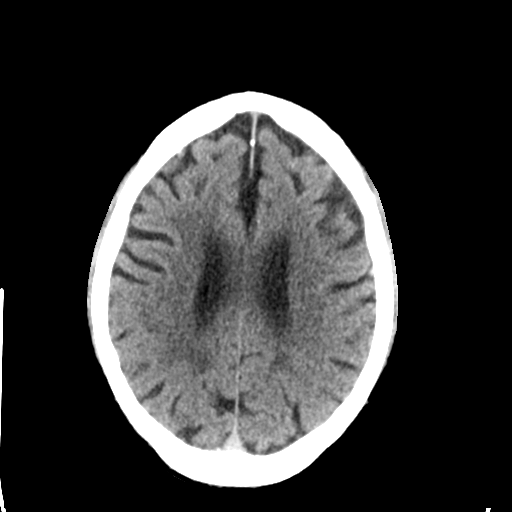
[im 19/29  brain]
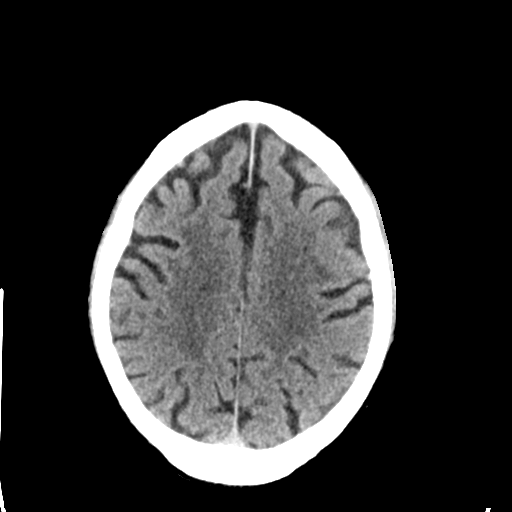
[im 21/29  brain]
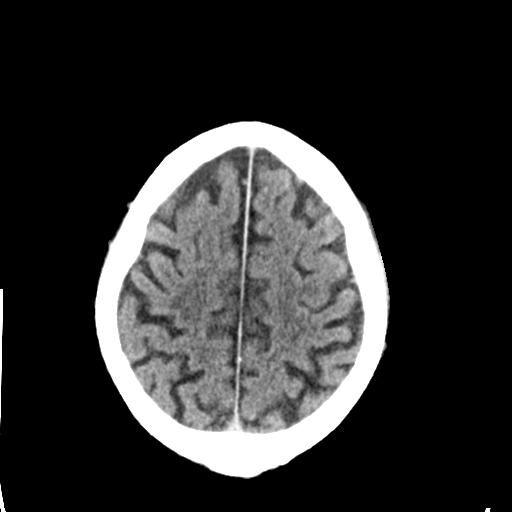
[im 23/29  brain]
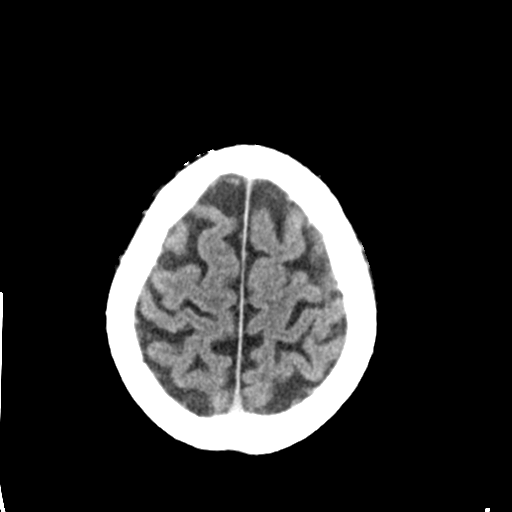
[im 23/29  bone]
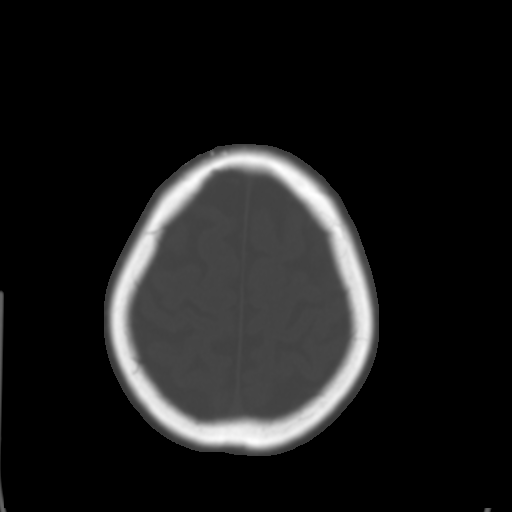
[im 24/29  brain]
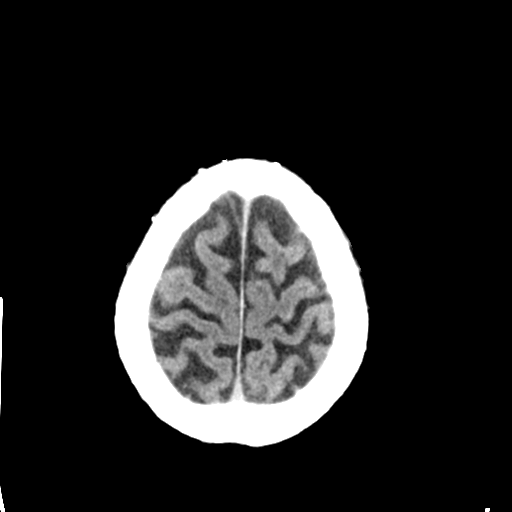
[im 26/29  brain]
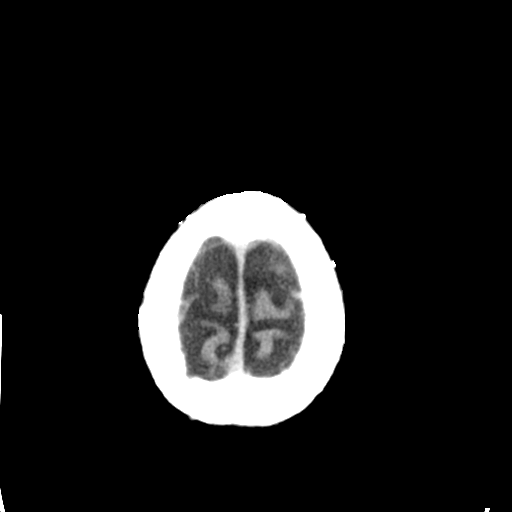
[im 28/29  brain]
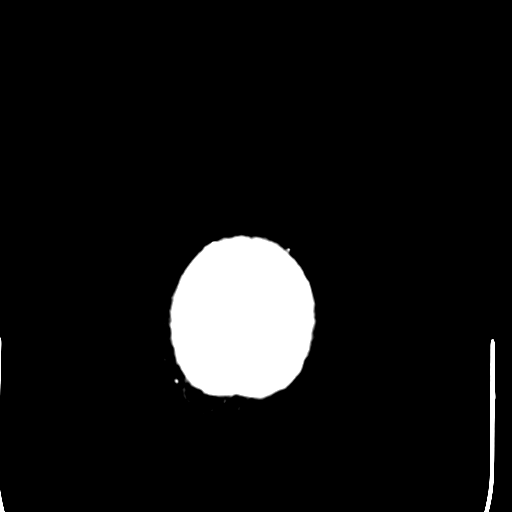

[16 of 29 positions shown; findings below may reference images not displayed]

FINDINGS: Generalized atrophy with mild chronic microvascular ischemic change
in the white matter. No acute infarct. Negative for hemorrhage or
mass. Negative for skull fracture.
IMPRESSION: No acute abnormality.

## 2015-12-23 ENCOUNTER — Encounter (HOSPITAL_COMMUNITY): Payer: Self-pay | Admitting: Family Medicine

## 2015-12-23 ENCOUNTER — Emergency Department (HOSPITAL_COMMUNITY)
Admission: EM | Admit: 2015-12-23 | Discharge: 2015-12-23 | Disposition: A | Discharge status: Home / Self Care | Payer: BC Managed Care – PPO | Attending: Emergency Medicine | Admitting: Emergency Medicine

## 2015-12-23 DIAGNOSIS — H9201 Otalgia, right ear: Secondary | ICD-10-CM | POA: Diagnosis present

## 2015-12-23 DIAGNOSIS — I10 Essential (primary) hypertension: Secondary | ICD-10-CM | POA: Insufficient documentation

## 2015-12-23 DIAGNOSIS — Z7982 Long term (current) use of aspirin: Secondary | ICD-10-CM | POA: Insufficient documentation

## 2015-12-23 DIAGNOSIS — L910 Hypertrophic scar: Secondary | ICD-10-CM | POA: Diagnosis not present

## 2015-12-23 DIAGNOSIS — Z87828 Personal history of other (healed) physical injury and trauma: Secondary | ICD-10-CM | POA: Insufficient documentation

## 2015-12-23 DIAGNOSIS — Z79899 Other long term (current) drug therapy: Secondary | ICD-10-CM | POA: Diagnosis not present

## 2015-12-23 NOTE — Discharge Instructions (Signed)
Return here as needed.  Follow-up with the, Dr. provided.  This is a keloid that can form after trauma to the skin

## 2015-12-23 NOTE — ED Notes (Signed)
Pt here for growth behind right ear. sts it has been getting bigger over the past year and now is painful.

## 2015-12-23 NOTE — ED Notes (Addendum)
PT reports he fell on ice in January 2017 and had a cut behind RT ear. Per PT ,he came to ED and had a suture placed to close lac behind Rt ear . Pt was instructed the suture would dissolve and  He did not need to return for removal of suture . Pt has developed a growth behind RT ear . Pt reports he went to another Doctor who injected growth and now PT feels like the growth is larger.

## 2015-12-23 NOTE — ED Provider Notes (Signed)
Medical screening examination/treatment/procedure(s) were conducted as a shared visit with non-physician practitioner(s) and myself.  I personally evaluated the patient during the encounter.   EKG Interpretation None     82 year old male presenting for evaluation of a soft tissue growth just behind the right ear. Exam is consistent with a keloid. Prior injury in the same place. Is not consistent with an infectious process. From patient's description it sounds like he has had intralesional steroid injection without success. No emergent intervention required. It is right over the mastoid and I can see how it would be uncomfortable with sleeping on this side. Can follow-up with plastic surgery if patient feels like discomfort or cosmetic concerns are enough to warrant this.  Raeford RazorStephen Latiesha Harada, MD 12/23/15 [phone]

## 2015-12-23 NOTE — ED Notes (Signed)
Declined W/C at D/C and was escorted to lobby by RN. 

## 2015-12-23 NOTE — ED Provider Notes (Signed)
CSN: [id number]     Arrival date & time 12/23/15  [id number] History   First MD Initiated Contact with Patient 12/23/15 [phone]     Chief Complaint  Patient presents with  . Otalgia     (Consider location/radiation/quality/duration/timing/severity/associated sxs/prior Treatment) HPI Patient presents to the emergency department with a skin growth behind his right ear.  The patient states that he fell back in January and had a laceration to that area.  He states they closed it and he was sent by his primary care doctor to see a specialist to inject the area, but seemed to get worse after that.  Patient denies any fever, nausea, vomiting, ear pain, weakness, dizziness, headache, blurred vision, back pain, neck pain, or syncope.  The patient states nothing seems make his condition, better or worse Past Medical History  Diagnosis Date  . Hypertension    History reviewed. No pertinent past surgical history. History reviewed. No pertinent family history. Social History  Substance Use Topics  . Smoking status: Never Smoker   . Smokeless tobacco: None  . Alcohol Use: No    Review of Systems   All other systems negative except as documented in the HPI. All pertinent positives and negatives as reviewed in the HPI. Allergies  Review of patient's allergies indicates no known allergies.  Home Medications   Prior to Admission medications   Medication Sig Start Date End Date Taking? Authorizing Provider  amLODipine (NORVASC) 5 MG tablet Take 5 mg by mouth daily.    Historical Provider, MD  aspirin EC 81 MG tablet Take 81 mg by mouth daily.    Historical Provider, MD  finasteride (PROSCAR) 5 MG tablet Take 5 mg by mouth daily.    Historical Provider, MD  hydrochlorothiazide (HYDRODIURIL) 25 MG tablet Take 25 mg by mouth daily.    Historical Provider, MD  IRON PO Take 1 tablet by mouth daily.    Historical Provider, MD  lovastatin (MEVACOR) 40 MG tablet Take 40 mg by mouth every evening.    Historical  Provider, MD  silodosin (RAPAFLO) 4 MG CAPS capsule Take 4 mg by mouth daily with breakfast.    Historical Provider, MD   BP 132/75 mmHg  Pulse 71  Temp(Src) 97.9 F (36.6 C) (Oral)  Resp 14  SpO2 99% Physical Exam  Constitutional: He is oriented to person, place, and time. He appears well-developed and well-nourished. No distress.  HENT:  Head: Normocephalic and atraumatic.    Right Ear: Hearing and tympanic membrane normal.  Left Ear: Hearing and tympanic membrane normal.  Neck: Normal range of motion. Neck supple.  Pulmonary/Chest: Effort normal.  Neurological: He is alert and oriented to person, place, and time.  Skin: Skin is warm and dry. No rash noted. No erythema.  Nursing note and vitals reviewed.   ED Course  Procedures (including critical care time) Labs Review Labs Reviewed - No data to display  Imaging Review No results found. I have personally reviewed and evaluated these images and lab results as part of my medical decision-making.  Patient be sent to plastic surgery.  Told to return here as needed.  Patient agrees the plan and all questions were answered.  Patient has what appears to be a keloid from the trauma to the area  Charlestine NightChristopher Dajanee Voorheis, PA-C 12/23/15 [id number]  Raeford RazorStephen Kohut, MD 12/24/15 [phone]

## 2019-06-26 ENCOUNTER — Other Ambulatory Visit: Payer: Self-pay

## 2019-06-26 ENCOUNTER — Emergency Department (HOSPITAL_COMMUNITY)
Admission: EM | Admit: 2019-06-26 | Discharge: 2019-06-26 | Disposition: A | Discharge status: Home / Self Care | Payer: BC Managed Care – PPO | Attending: Emergency Medicine | Admitting: Emergency Medicine

## 2019-06-26 DIAGNOSIS — Z7982 Long term (current) use of aspirin: Secondary | ICD-10-CM | POA: Diagnosis not present

## 2019-06-26 DIAGNOSIS — W268XXA Contact with other sharp object(s), not elsewhere classified, initial encounter: Secondary | ICD-10-CM | POA: Insufficient documentation

## 2019-06-26 DIAGNOSIS — S81811A Laceration without foreign body, right lower leg, initial encounter: Secondary | ICD-10-CM | POA: Diagnosis present

## 2019-06-26 DIAGNOSIS — Y93E5 Activity, floor mopping and cleaning: Secondary | ICD-10-CM | POA: Insufficient documentation

## 2019-06-26 DIAGNOSIS — Y999 Unspecified external cause status: Secondary | ICD-10-CM | POA: Insufficient documentation

## 2019-06-26 DIAGNOSIS — Z79899 Other long term (current) drug therapy: Secondary | ICD-10-CM | POA: Insufficient documentation

## 2019-06-26 DIAGNOSIS — Z23 Encounter for immunization: Secondary | ICD-10-CM | POA: Insufficient documentation

## 2019-06-26 DIAGNOSIS — Y92002 Bathroom of unspecified non-institutional (private) residence single-family (private) house as the place of occurrence of the external cause: Secondary | ICD-10-CM | POA: Insufficient documentation

## 2019-06-26 DIAGNOSIS — I10 Essential (primary) hypertension: Secondary | ICD-10-CM | POA: Insufficient documentation

## 2019-06-26 MED ORDER — LIDOCAINE-EPINEPHRINE (PF) 2 %-1:200000 IJ SOLN
INTRAMUSCULAR | Status: AC
  Administered 2019-06-26: 10 mL
  Filled 2019-06-26: qty 20

## 2019-06-26 MED ORDER — TETANUS-DIPHTH-ACELL PERTUSSIS 5-2.5-18.5 LF-MCG/0.5 IM SUSP
0.5000 mL | Freq: Once | INTRAMUSCULAR | Status: AC
  Administered 2019-06-26: 20:00:00 0.5 mL via INTRAMUSCULAR
  Filled 2019-06-26: qty 0.5

## 2019-06-26 MED ORDER — LIDOCAINE-EPINEPHRINE 1 %-1:100000 IJ SOLN
10.0000 mL | Freq: Once | INTRAMUSCULAR | Status: DC
  Filled 2019-06-26: qty 10

## 2019-06-26 NOTE — Discharge Instructions (Signed)
You may place bacitracin or other over the counter antibiotic ointment over the wound and change dressing once daily. Keep it dry and keep dressing in place until tomorrow night.

## 2019-06-26 NOTE — ED Triage Notes (Signed)
Patient reports dropping toilet seat on leg this afternoon and cutting R shin. States it wouldn't stop bleeding without a bandage. Not on blood thinners. Denies pain. Ambulatory with steady gait.

## 2019-06-26 NOTE — ED Provider Notes (Signed)
[facility] EMERGENCY DEPARTMENT Provider Note   CSN: [id number] Arrival date & time: 06/26/19  1811     History   Chief Complaint Chief Complaint  Patient presents with  . Extremity Laceration    HPI Arnulfo Batson is a 85 y.o. male.     HPI   Very pleasant 85 year old male with a history of hypertension presents with concern for right lower extremity laceration which occurred as he was attempting to clean the toilet.  Patient reports that he was taking the back off of the toilet and as he was holding the porcelain top he accidentally dropped it, with the top breaking and cutting his right lower extremity.  It occurred at approximately 3:30 PM.  He cleaned it and wrapped the area with a tie and it initially stopped bleeding, however just prior to arrival he remove the tie and it began bleeding again.  Given the continued bleeding and laceration he presented to the emergency department for further evaluation.  Reports he has been ambulatory on the leg and does not have any significant pain other than the laceration.  He does not know when his last tetanus shot was.  Denies any other acute illness.  Past Medical History:  Diagnosis Date  . Hypertension     There are no active problems to display for this patient.   No past surgical history on file.      Home Medications    Prior to Admission medications   Medication Sig Start Date End Date Taking? Authorizing Provider  amLODipine (NORVASC) 5 MG tablet Take 5 mg by mouth daily.    [provider]  aspirin EC 81 MG tablet Take 81 mg by mouth daily.    [provider]  finasteride (PROSCAR) 5 MG tablet Take 5 mg by mouth daily.    [provider]  hydrochlorothiazide (HYDRODIURIL) 25 MG tablet Take 25 mg by mouth daily.    [provider]  IRON PO Take 1 tablet by mouth daily.    [provider]  lovastatin (MEVACOR) 40 MG tablet Take 40 mg by mouth every  evening.    [provider]  silodosin (RAPAFLO) 4 MG CAPS capsule Take 4 mg by mouth daily with breakfast.    [provider]    Family History No family history on file.  Social History Social History   Tobacco Use  . Smoking status: Never Smoker  Substance Use Topics  . Alcohol use: No  . Drug use: Not on file     Allergies   Patient has no known allergies.   Review of Systems Review of Systems  Constitutional: Negative for fever.  Musculoskeletal: Negative for arthralgias and gait problem.  Skin: Positive for wound.     Physical Exam Updated Vital Signs BP 138/78 (BP Location: Left Arm)   Pulse 72   Temp 97.9 F (36.6 C) (Oral)   Resp 16   SpO2 99%   Physical Exam Vitals signs and nursing note reviewed.  Constitutional:      General: He is not in acute distress.    Appearance: He is well-developed. He is not diaphoretic.  HENT:     Head: Normocephalic and atraumatic.  Eyes:     Conjunctiva/sclera: Conjunctivae normal.  Neck:     Musculoskeletal: Normal range of motion.  Cardiovascular:     Rate and Rhythm: Normal rate and regular rhythm.  Pulmonary:     Effort: Pulmonary effort is normal. No respiratory distress.  Breath sounds: No rales.  Skin:    General: Skin is warm and dry.     Comments: 2.5cm laceration right shin  Neurological:     Mental Status: He is alert and oriented to person, place, and time.      ED Treatments / Results  Labs (all labs ordered are listed, but only abnormal results are displayed) Labs Reviewed - No data to display  EKG None  Radiology No results found.  Procedures .Marland Kitchen.Laceration Repair  Date/Time: 06/27/2019 11:18 AM Performed by: Alvira MondaySchlossman, Neleh Muldoon, MD Authorized by: Alvira MondaySchlossman, Cynthya Yam, MD   Consent:    Consent obtained:  Verbal   Consent given by:  Patient   Risks discussed:  Infection and pain   Alternatives discussed:  No treatment Anesthesia (see MAR for exact dosages):     Anesthesia method:  Local infiltration   Local anesthetic:  Lidocaine 2% WITH epi Laceration details:    Location:  Leg   Leg location:  R lower leg   Length (cm):  2.5 Repair type:    Repair type:  Simple Pre-procedure details:    Preparation:  Patient was prepped and draped in usual sterile fashion Exploration:    Hemostasis achieved with:  Direct pressure   Wound exploration: entire depth of wound probed and visualized   Treatment:    Area cleansed with:  Saline   Amount of cleaning:  Standard   Irrigation solution:  Sterile saline   Irrigation volume:  350 Skin repair:    Repair method:  Sutures   Suture size:  4-0   Suture material:  Prolene   Suture technique:  Simple interrupted   Number of sutures:  3 Approximation:    Approximation:  Close Post-procedure details:    Dressing:  Antibiotic ointment   Patient tolerance of procedure:  Tolerated well, no immediate complications   (including critical care time)  Medications Ordered in ED Medications  Tdap (BOOSTRIX) injection 0.5 mL (0.5 mLs Intramuscular Given 06/26/19 2017)  lidocaine-EPINEPHrine (XYLOCAINE W/EPI) 2 %-1:200000 (PF) injection (10 mLs  Given 06/26/19 2020)     Initial Impression / Assessment and Plan / ED Course  I have reviewed the triage vital signs and the nursing notes.  Pertinent labs & imaging results that were available during my care of the patient were reviewed by me and considered in my medical decision making (see chart for details).        Very pleasant 85 year old male with a history of hypertension presents with concern for right lower extremity laceration from the top of a toilet.  No other injuries, is ambulatory, no significant pain, doubt fracture. No sign of FB.  TDaP updated.  Mild continued bleeding. Discussed options for repair and repaired with 3 prolene sutures. Recommend return in 10-14 days for suture removal. Patient discharged in stable condition with understanding of reasons  to return.   Final Clinical Impressions(s) / ED Diagnoses   Final diagnoses:  Laceration of right lower extremity, initial encounter    ED Discharge Orders    None       Alvira MondaySchlossman, Nuala Chiles, MD 06/27/19 1120

## 2019-06-26 NOTE — ED Notes (Signed)
Patient Alert and oriented to baseline. Stable and ambulatory to baseline. Patient verbalized understanding of the discharge instructions.  Patient belongings were taken by the patient.   

## 2019-07-10 ENCOUNTER — Encounter (HOSPITAL_COMMUNITY): Payer: Self-pay | Admitting: Emergency Medicine

## 2019-07-10 ENCOUNTER — Ambulatory Visit (HOSPITAL_COMMUNITY)
Admission: EM | Admit: 2019-07-10 | Discharge: 2019-07-10 | Disposition: A | Discharge status: Home / Self Care | Payer: Federal, State, Local not specified - PPO

## 2019-07-10 ENCOUNTER — Other Ambulatory Visit: Payer: Self-pay

## 2019-07-10 DIAGNOSIS — Z4802 Encounter for removal of sutures: Secondary | ICD-10-CM

## 2019-07-10 NOTE — ED Triage Notes (Signed)
Pt here for suture removal; 3 sutures removed from right leg; wound approximated and well healed

## 2023-03-23 ENCOUNTER — Ambulatory Visit (HOSPITAL_COMMUNITY)
Admission: EM | Admit: 2023-03-23 | Discharge: 2023-03-23 | Disposition: A | Discharge status: Home / Self Care | Payer: Federal, State, Local not specified - PPO | Attending: Family Medicine | Admitting: Family Medicine

## 2023-03-23 ENCOUNTER — Encounter (HOSPITAL_COMMUNITY): Payer: Self-pay

## 2023-03-23 DIAGNOSIS — J069 Acute upper respiratory infection, unspecified: Secondary | ICD-10-CM | POA: Diagnosis not present

## 2023-03-23 MED ORDER — BENZONATATE 100 MG PO CAPS: 100.0000 mg | ORAL_CAPSULE | Freq: Three times a day (TID) | ORAL | 0 refills | Status: AC | PRN

## 2023-03-23 MED ORDER — PREDNISONE 20 MG PO TABS: 40.0000 mg | ORAL_TABLET | Freq: Every day | ORAL | 0 refills | Status: AC

## 2023-03-23 NOTE — ED Provider Notes (Signed)
[facility]    CSN: [id number] Arrival date & time: 03/23/23  1005      History   Chief Complaint Chief Complaint  Patient presents with   Cough   Nasal Congestion    HPI Ryan Nash is a 89 y.o. male.    Cough  Here for a 6-day history of congestion and cough. Initially he states to me and to staff that he has had a cough for 5 weeks.  On closer questioning, he relates that about 4 weeks ago he had had an illness with some coughing and his doctor had sent in amoxicillin and cough syrup.  He did improve while on those medications and then 2 weeks later or about 6 days ago, he began having some cough and some nasal congestion.  He does sometimes cough up some mucus.  No fever or chills and no shortness of breath.  He has never had a history of COPD or asthma that he knows of.  No nausea or vomiting    Past Medical History:  Diagnosis Date   Hypertension     There are no problems to display for this patient.   History reviewed. No pertinent surgical history.     Home Medications    Prior to Admission medications   Medication Sig Start Date End Date Taking? Authorizing Provider  amLODipine (NORVASC) 5 MG tablet Take 5 mg by mouth daily.   Yes [provider]  aspirin EC 81 MG tablet Take 81 mg by mouth daily.   Yes [provider]  Avanafil (STENDRA) 200 MG TABS    Yes [provider]  benzonatate (TESSALON) 100 MG capsule Take 1 capsule (100 mg total) by mouth 3 (three) times daily as needed for cough. 03/23/23  Yes Zenia Resides, MD  finasteride (PROSCAR) 5 MG tablet Take 5 mg by mouth daily.   Yes [provider]  hydrochlorothiazide (HYDRODIURIL) 25 MG tablet Take 25 mg by mouth daily.   Yes [provider]  lovastatin (MEVACOR) 40 MG tablet Take 40 mg by mouth every evening.   Yes [provider]  PARoxetine (PAXIL) 20 MG tablet    Yes [provider]  predniSONE (DELTASONE) 20  MG tablet Take 2 tablets (40 mg total) by mouth daily with breakfast for 3 days. 03/23/23 03/26/23 Yes Jomo Forand, Janace Aris, MD  silodosin (RAPAFLO) 4 MG CAPS capsule Take 4 mg by mouth daily with breakfast.   Yes [provider]  IRON PO Take 1 tablet by mouth daily.    [provider]    Family History No family history on file.  Social History Social History   Tobacco Use   Smoking status: Never  Substance Use Topics   Alcohol use: No     Allergies   Patient has no known allergies.   Review of Systems Review of Systems  Respiratory:  Positive for cough.      Physical Exam Triage Vital Signs ED Triage Vitals [03/23/23 1029]  Enc Vitals Group     BP 123/77     Pulse Rate 70     Resp 16     Temp 98.1 F (36.7 C)     Temp Source Oral     SpO2 95 %     Weight      Height      Head Circumference      Peak Flow      Pain Score      Pain Loc  Pain Edu?      Excl. in GC?    No data found.  Updated Vital Signs BP 123/77 (BP Location: Left Arm)   Pulse 70   Temp 98.1 F (36.7 C) (Oral)   Resp 16   SpO2 95%   Visual Acuity Right Eye Distance:   Left Eye Distance:   Bilateral Distance:    Right Eye Near:   Left Eye Near:    Bilateral Near:     Physical Exam Vitals reviewed.  Constitutional:      General: He is not in acute distress.    Appearance: He is not ill-appearing, toxic-appearing or diaphoretic.  HENT:     Left Ear: Ear canal normal.     Nose: Congestion present.     Mouth/Throat:     Mouth: Mucous membranes are moist.     Pharynx: No oropharyngeal exudate or posterior oropharyngeal erythema.  Eyes:     Extraocular Movements: Extraocular movements intact.     Conjunctiva/sclera: Conjunctivae normal.     Pupils: Pupils are equal, round, and reactive to light.  Cardiovascular:     Rate and Rhythm: Normal rate and regular rhythm.     Heart sounds: No murmur heard. Pulmonary:     Effort: No respiratory distress.      Breath sounds: No stridor. No wheezing, rhonchi or rales.     Comments: No definite wheezes heard, but a couple of times on expiration there is a coarse breath sound. Musculoskeletal:     Cervical back: Neck supple.  Lymphadenopathy:     Cervical: No cervical adenopathy.  Skin:    Capillary Refill: Capillary refill takes less than 2 seconds.     Coloration: Skin is not jaundiced or pale.  Neurological:     General: No focal deficit present.     Mental Status: He is alert and oriented to person, place, and time.  Psychiatric:        Behavior: Behavior normal.      UC Treatments / Results  Labs (all labs ordered are listed, but only abnormal results are displayed) Labs Reviewed - No data to display  EKG   Radiology No results found.  Procedures Procedures (including critical care time)  Medications Ordered in UC Medications - No data to display  Initial Impression / Assessment and Plan / UC Course  I have reviewed the triage vital signs and the nursing notes.  Pertinent labs & imaging results that were available during my care of the patient were reviewed by me and considered in my medical decision making (see chart for details).        I discussed with him that most the time these illnesses are viral in origin.  I do not think he needs antibiotics at this time.  He has passed the timeframe when viral swabs would be helpful in any way.  Since he has some coarse breath sounds and will do 3 days of prednisone on the prescription and send him any medicine for cough Final Clinical Impressions(s) / UC Diagnoses   Final diagnoses:  Viral upper respiratory tract infection     Discharge Instructions      Take prednisone 20 mg--2 daily for 3 days  Take benzonatate 100 mg, 1 tab every 8 hours as needed for cough.      ED Prescriptions     Medication Sig Dispense Auth. Provider   benzonatate (TESSALON) 100 MG capsule Take 1 capsule (100 mg total) by mouth 3  (three) times  daily as needed for cough. 21 capsule Zenia Resides, MD   predniSONE (DELTASONE) 20 MG tablet Take 2 tablets (40 mg total) by mouth daily with breakfast for 3 days. 6 tablet Marlinda Mike Janace Aris, MD      PDMP not reviewed this encounter.   Zenia Resides, MD 03/23/23 1106

## 2023-03-23 NOTE — ED Triage Notes (Signed)
Pt presents to office for cough and nasal congestion x 2 weeks.  Pt states he just finish Amoxicillin 2 weeks ago.

## 2023-03-23 NOTE — Discharge Instructions (Signed)
Take prednisone 20 mg--2 daily for 3 days  Take benzonatate 100 mg, 1 tab every 8 hours as needed for cough.
